# Patient Record
Sex: Female | Born: 1945 | Race: White | Hispanic: No
Health system: Southern US, Community
[De-identification: ages and names within clinical notes are randomized; demographics above are authoritative.]

## PROBLEM LIST (undated history)

## (undated) DIAGNOSIS — I10 Essential (primary) hypertension: Secondary | ICD-10-CM

---

## 2017-10-19 ENCOUNTER — Emergency Department (HOSPITAL_COMMUNITY): Payer: Medicare Other

## 2017-10-19 ENCOUNTER — Encounter (HOSPITAL_COMMUNITY): Payer: Self-pay | Admitting: Emergency Medicine

## 2017-10-19 ENCOUNTER — Emergency Department (HOSPITAL_COMMUNITY)
Admission: EM | Admit: 2017-10-19 | Discharge: 2017-10-19 | Disposition: A | Discharge status: Home / Self Care | Payer: Medicare Other | Attending: Emergency Medicine | Admitting: Emergency Medicine

## 2017-10-19 DIAGNOSIS — S4981XA Other specified injuries of right shoulder and upper arm, initial encounter: Secondary | ICD-10-CM | POA: Diagnosis present

## 2017-10-19 DIAGNOSIS — X500XXA Overexertion from strenuous movement or load, initial encounter: Secondary | ICD-10-CM | POA: Diagnosis not present

## 2017-10-19 DIAGNOSIS — Y9384 Activity, sleeping: Secondary | ICD-10-CM | POA: Diagnosis not present

## 2017-10-19 DIAGNOSIS — Y999 Unspecified external cause status: Secondary | ICD-10-CM | POA: Insufficient documentation

## 2017-10-19 DIAGNOSIS — Y929 Unspecified place or not applicable: Secondary | ICD-10-CM | POA: Diagnosis not present

## 2017-10-19 DIAGNOSIS — Z79899 Other long term (current) drug therapy: Secondary | ICD-10-CM | POA: Diagnosis not present

## 2017-10-19 DIAGNOSIS — I10 Essential (primary) hypertension: Secondary | ICD-10-CM | POA: Diagnosis not present

## 2017-10-19 DIAGNOSIS — S43004A Unspecified dislocation of right shoulder joint, initial encounter: Secondary | ICD-10-CM

## 2017-10-19 HISTORY — DX: Essential (primary) hypertension: I10

## 2017-10-19 MED ORDER — MORPHINE SULFATE (PF) 4 MG/ML IV SOLN
4.0000 mg | Freq: Once | INTRAVENOUS | Status: AC
  Administered 2017-10-19: 4 mg via INTRAVENOUS
  Filled 2017-10-19: qty 1

## 2017-10-19 MED ORDER — PROPOFOL 10 MG/ML IV BOLUS
0.5000 mg/kg | Freq: Once | INTRAVENOUS | Status: DC
  Filled 2017-10-19: qty 20

## 2017-10-19 MED ORDER — SODIUM CHLORIDE 0.9 % IV BOLUS
1000.0000 mL | Freq: Once | INTRAVENOUS | Status: AC
  Administered 2017-10-19: 1000 mL via INTRAVENOUS

## 2017-10-19 MED ORDER — ONDANSETRON HCL 4 MG/2ML IJ SOLN
4.0000 mg | Freq: Once | INTRAMUSCULAR | Status: AC
  Administered 2017-10-19: 4 mg via INTRAVENOUS
  Filled 2017-10-19: qty 2

## 2017-10-19 MED ORDER — HYDROMORPHONE HCL 1 MG/ML IJ SOLN
0.5000 mg | Freq: Once | INTRAMUSCULAR | Status: AC
  Administered 2017-10-19: 0.5 mg via INTRAVENOUS
  Filled 2017-10-19: qty 1

## 2017-10-19 NOTE — ED Provider Notes (Signed)
[facility]-EMERGENCY DEPT Provider Note   CSN: [id number] Arrival date & time: 10/19/17  1204     History   Chief Complaint Chief Complaint  Patient presents with  . Dislocated shoulder    HPI Alicia Roman is a 72 y.o. female with history of hypertension presents for evaluation of acute onset, constant right shoulder pain since early this morning.  On 6/5 she sustained a fall on her outst[place] airport.  She denies head injury or loss of consciousness.  Her right shoulder was dislocated and a reduction was performed at an outside hospital in [place].  She was placed in a sling and states that her recovery has been uneventful until this morning when she rolled over in bed half asleep and raised her right upper extremity above her head  At around 9 AM.  She notes immediate onset of throbbing pain to the right shoulder as well as numbness and tingling of her bilateral hands.  Pain worsens with any movement or attempts at palpation.  She has not been able to move the extremity since this occurred.  She thinks it is dislocated again.  Denies neck pain, chest pain, or shortness of breath.  She went to see Dr. Aundria Rudogers at [facility] earlier today and a reduction was attempted in the office but was unsuccessful.  He recommended presentation to the ED for second attempt.  Of note, the patient did not take her blood pressure medicine today.  The history is provided by the patient.    Past Medical History:  Diagnosis Date  . Hypertension     There are no active problems to display for this patient.  OB History   None      Home Medications    Prior to Admission medications   Medication Sig Start Date End Date Taking? Authorizing Provider  CALCIUM PO Take 1 tablet by mouth daily.    Yes [provider]  cholecalciferol (VITAMIN D) 1000 units tablet Take 1,000 Units by mouth daily.   Yes [provider]  hydrochlorothiazide  (HYDRODIURIL) 25 MG tablet Take 25 mg by mouth daily.   Yes [provider]  lisinopril (PRINIVIL,ZESTRIL) 40 MG tablet Take 40 mg by mouth daily.   Yes [provider]  Multiple Vitamins-Minerals (MULTIVITAMIN ADULT PO) Take 1 tablet by mouth daily.    Yes [provider]  Omega-3 Fatty Acids (FISH OIL) 1000 MG CAPS Take 1 capsule by mouth daily.    Yes [provider]    Family History No family history on file.  Social History Social History   Tobacco Use  . Smoking status: Not on file  Substance Use Topics  . Alcohol use: Not on file  . Drug use: Not on file     Allergies   Patient has no known allergies.   Review of Systems Review of Systems  Constitutional: Negative for chills and fever.  Musculoskeletal: Positive for arthralgias. Negative for neck pain.  Neurological: Positive for numbness. Negative for syncope and headaches.  All other systems reviewed and are negative.    Physical Exam Updated Vital Signs BP 124/62   Pulse 67   Resp 14   Wt 79.4 kg (175 lb)   SpO2 94%   Physical Exam  Constitutional: She appears well-developed and well-nourished. No distress.  HENT:  Head: Normocephalic and atraumatic.  Eyes: Conjunctivae are normal. Right eye exhibits no discharge. Left eye exhibits no discharge.  Neck: Normal range of  motion. Neck supple. No JVD present. No tracheal deviation present.  No midline spine TTP, no paraspinal muscle tenderness, no deformity, crepitus, or step-off noted   Cardiovascular: Normal rate, regular rhythm and intact distal pulses.  2+ radial pulses bilaterally  Pulmonary/Chest: Effort normal and breath sounds normal.  Abdominal: Soft. Bowel sounds are normal. She exhibits no distension. There is no tenderness.  Musculoskeletal: She exhibits tenderness. She exhibits no edema.  Tenderness to palpation of the right shoulder anteriorly.  No active range of motion of the right shoulder, unable to  tolerate passive range of motion secondary to pain.  Good grip strength bilaterally  Neurological: She is alert.  Fluent speech with no evidence of dysarthria or aphasia, no facial droop.  Altered sensation to soft touch of the bilateral hands.  Skin: Skin is warm and dry. No erythema.  Psychiatric: She has a normal mood and affect. Her behavior is normal.  Nursing note and vitals reviewed.    ED Treatments / Results  Labs (all labs ordered are listed, but only abnormal results are displayed) Labs Reviewed - No data to display  EKG None  Radiology Dg Shoulder Right  Result Date: 10/19/2017 CLINICAL DATA:  Status post reduction of right shoulder dislocation. EXAM: RIGHT SHOULDER - 2+ VIEW COMPARISON:  10/19/2017 at 1444 hours FINDINGS: The previously seen dislocation has been reduced, and the humeral head is now seated in the glenoid fossa. No acute fracture is identified. The soft tissues are unremarkable. IMPRESSION: Interval reduction of right shoulder dislocation. Electronically Signed   By: Sebastian Ache M.D.   On: 10/19/2017 16:54   Dg Shoulder Right  Result Date: 10/19/2017 CLINICAL DATA:  Postreduction. EXAM: RIGHT SHOULDER - 2+ VIEW COMPARISON:  Right shoulder x-rays from same day. FINDINGS: Persistent anterior dislocation of the humeral head with respect to the glenoid. No acute fracture. IMPRESSION: Persistent anterior shoulder dislocation. Electronically Signed   By: Obie Dredge M.D.   On: 10/19/2017 15:02   Dg Shoulder Right  Result Date: 10/19/2017 CLINICAL DATA:  Shoulder dislocation. EXAM: RIGHT SHOULDER - 2+ VIEW COMPARISON:  None. FINDINGS: Three view study shows anterior subcoracoid dislocation of the right humeral head. No associated fracture fragment evident. Coracoclavicular and acromioclavicular distances are preserved. IMPRESSION: Anterior right humeral head dislocation. Electronically Signed   By: Kennith Center M.D.   On: 10/19/2017 13:57     Procedures Reduction of dislocation Date/Time: 10/19/2017 6:23 PM Performed by: Cathren Laine, MD Authorized by: Cathren Laine, MD  Consent: Verbal consent obtained. Written consent obtained. Risks and benefits: risks, benefits and alternatives were discussed Consent given by: patient Patient understanding: patient states understanding of the procedure being performed Patient consent: the patient's understanding of the procedure matches consent given Procedure consent: procedure consent matches procedure scheduled Relevant documents: relevant documents present and verified Test results: test results available and properly labeled Imaging studies: imaging studies available Required items: required blood products, implants, devices, and special equipment available Patient identity confirmed: verbally with patient and arm band Local anesthesia used: no  Anesthesia: Local anesthesia used: no  Sedation: Patient sedated: no  Patient tolerance: Patient tolerated the procedure well with no immediate complications    (including critical care time)  Medications Ordered in ED Medications  propofol (DIPRIVAN) 10 mg/mL bolus/IV push 39.7 mg (39.7 mg Intravenous Not Given 10/19/17 1743)  ondansetron (ZOFRAN) injection 4 mg (4 mg Intravenous Given 10/19/17 1308)  morphine 4 MG/ML injection 4 mg (4 mg Intravenous Given 10/19/17 1308)  sodium chloride 0.9 %  bolus 1,000 mL (0 mLs Intravenous Stopped 10/19/17 1415)  HYDROmorphone (DILAUDID) injection 0.5 mg (0.5 mg Intravenous Given 10/19/17 1354)  ondansetron (ZOFRAN) injection 4 mg (4 mg Intravenous Given 10/19/17 1355)     Initial Impression / Assessment and Plan / ED Course  I have reviewed the triage vital signs and the nursing notes.  Pertinent labs & imaging results that were available during my care of the patient were reviewed by me and considered in my medical decision making (see chart for details).     Patient presents with  right anterior humeral head dislocation.  She dislocated her shoulder 8 days ago and this was reduced at an outside hospital.  She is afebrile, initially quite hypertensive but had not been taking her blood pressure medicines and was in a considerable amount of pain on initial evaluation.  Nontoxic in appearance.  She had numbness to soft touch of the bilateral upper extremities.  Radiographs show anterior right humeral head dislocation.  Dr. Denton Lank attempted reduction without conscious sedation, repeat radiographs show persistent shoulder dislocation.  Second attempt was successful.  On reevaluation she is neurovascularly intact.  She was placed in a shoulder immobilizer.  She will follow-up with Dr. Aundria Rud at [facility] for reevaluation or with her orthopedist in [place] when she returns home.  She has had significant improvement in her pain. Compartments are soft. Discussed strict ED return precautions.  Patient and patient's daughter verbalized understanding of and agreement with plan and patient is stable for discharge home at this time.  Patient was seen and evaluated by Dr. Denton Lank who agrees with assessment and plan at this time.  Final Clinical Impressions(s) / ED Diagnoses   Final diagnoses:  Dislocation of right shoulder joint, initial encounter    ED Discharge Orders    None       Bennye Alm 10/19/17 2034    Cathren Laine, MD 10/20/17 [phone]

## 2017-10-19 NOTE — Discharge Instructions (Signed)
You may alternate 600 mg of ibuprofen and [phone] mg of Tylenol every 3-4  hours as needed for pain. Do not exceed 4000 mg of Tylenol daily.  Take ibuprofen with food to avoid upset stomach issues.  You may apply ice or heat to the shoulder 20 minutes on 20 minutes off as needed for comfort.  Wear the shoulder sling at all times except when showering, make sure you do not move your shoulder much when you are not wearing the immobilizer.  Call the orthopedist to schedule a follow-up appointment in the next 1 to 2 weeks.  Do not take off the shoulder immobilizer until they tell you to.  Return to the emergency department if any concerning signs or symptoms develop such as fevers, redness of the joint, weakness, loss of pulses, or significant swelling.

## 2017-10-19 NOTE — ED Triage Notes (Signed)
Per patient/daughter-states she [place] the airport [place] and dislocated right shoulder-had it put back in place-states came back form vacation yesterday and while she was sleeping she rolled over and dislocated it again-states she went to Ortho and they were unable to get it back into place-sent her her for further eval-before and after films were done at office

## 2018-11-20 ENCOUNTER — Other Ambulatory Visit: Payer: Medicare Other

## 2019-08-24 IMAGING — DX DG SHOULDER 2+V*R*
2 series · 2 of 2 positions shown · non-contrast
Comparison: 10/19/2017 at 3888 hours

CLINICAL DATA: Status post reduction of right shoulder dislocation.

EXAM:
RIGHT SHOULDER - 2+ VIEW

[shoulder axial]
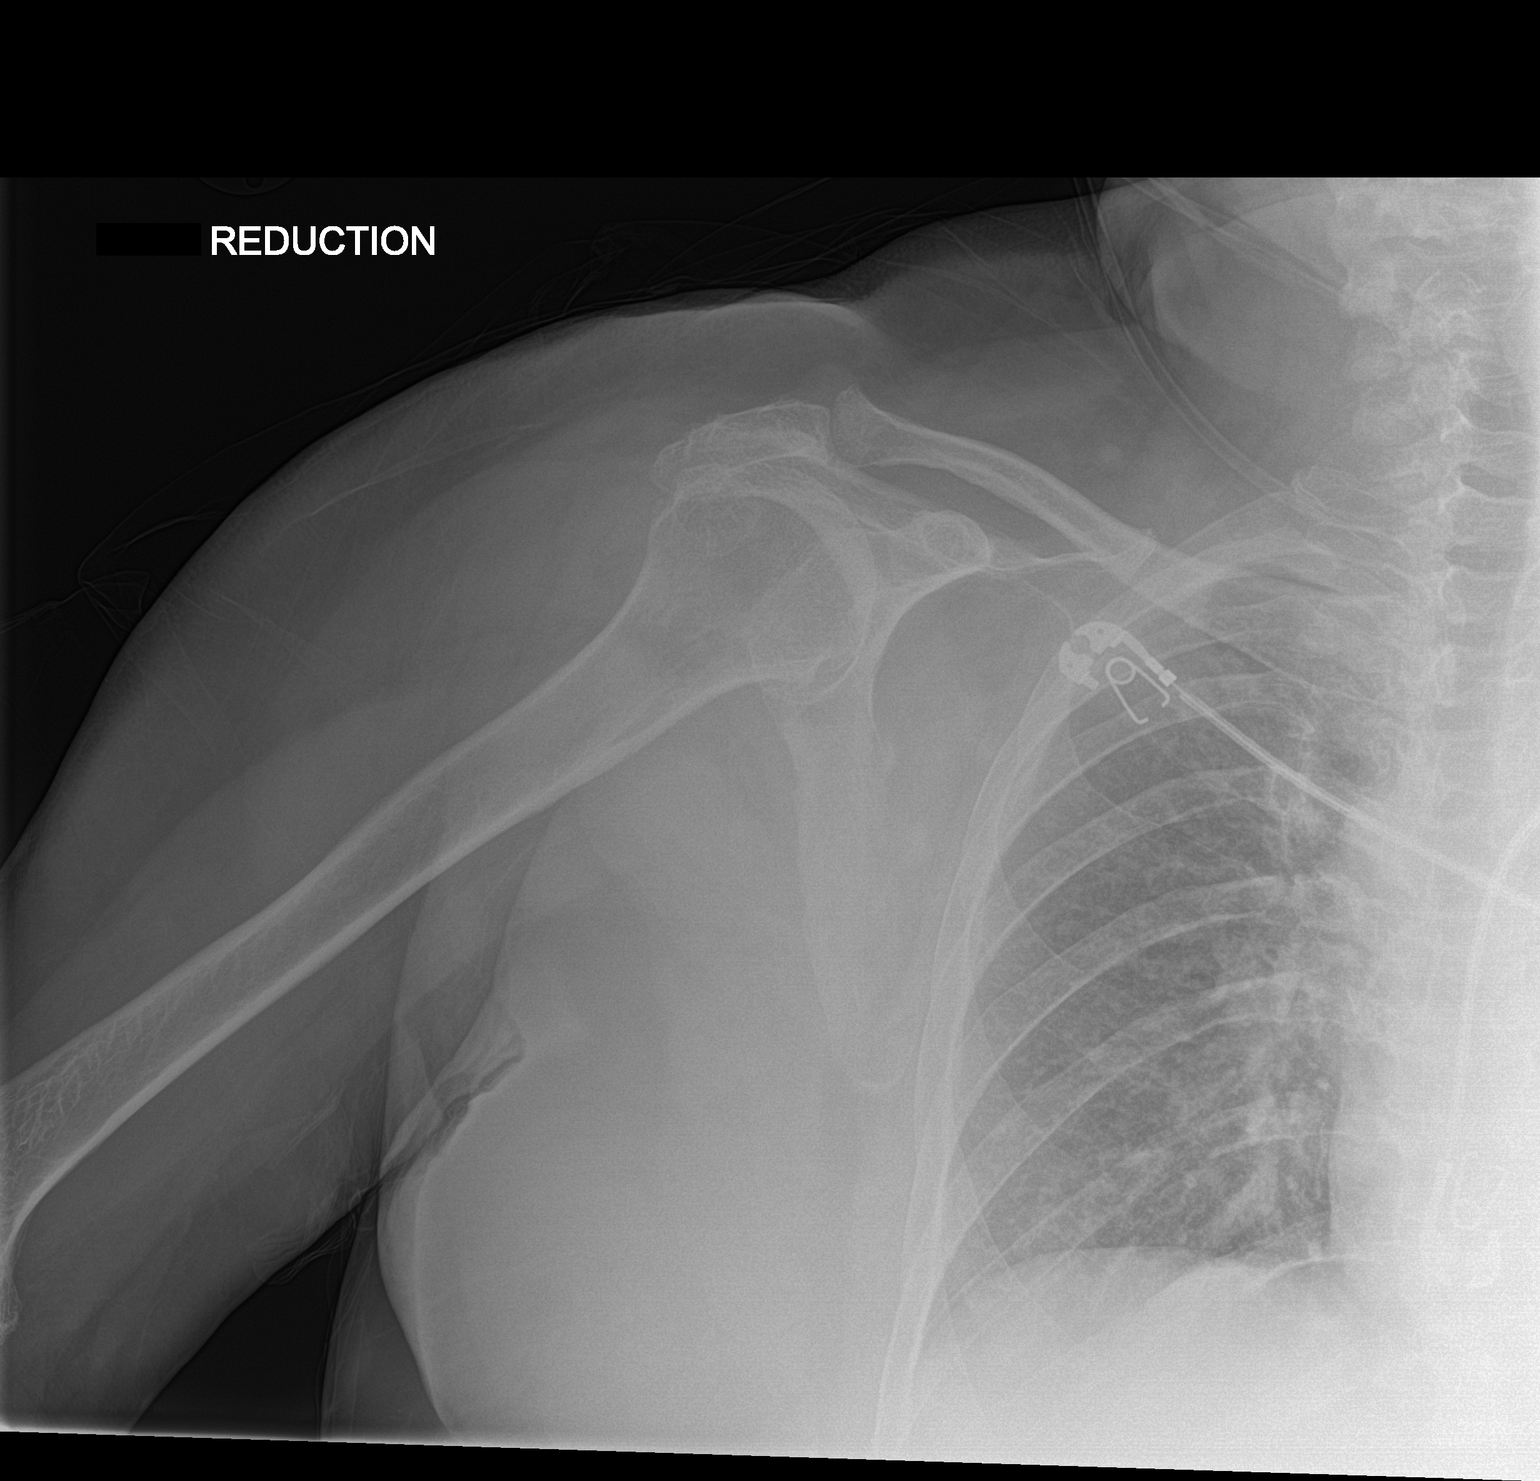

[shoulder ap]
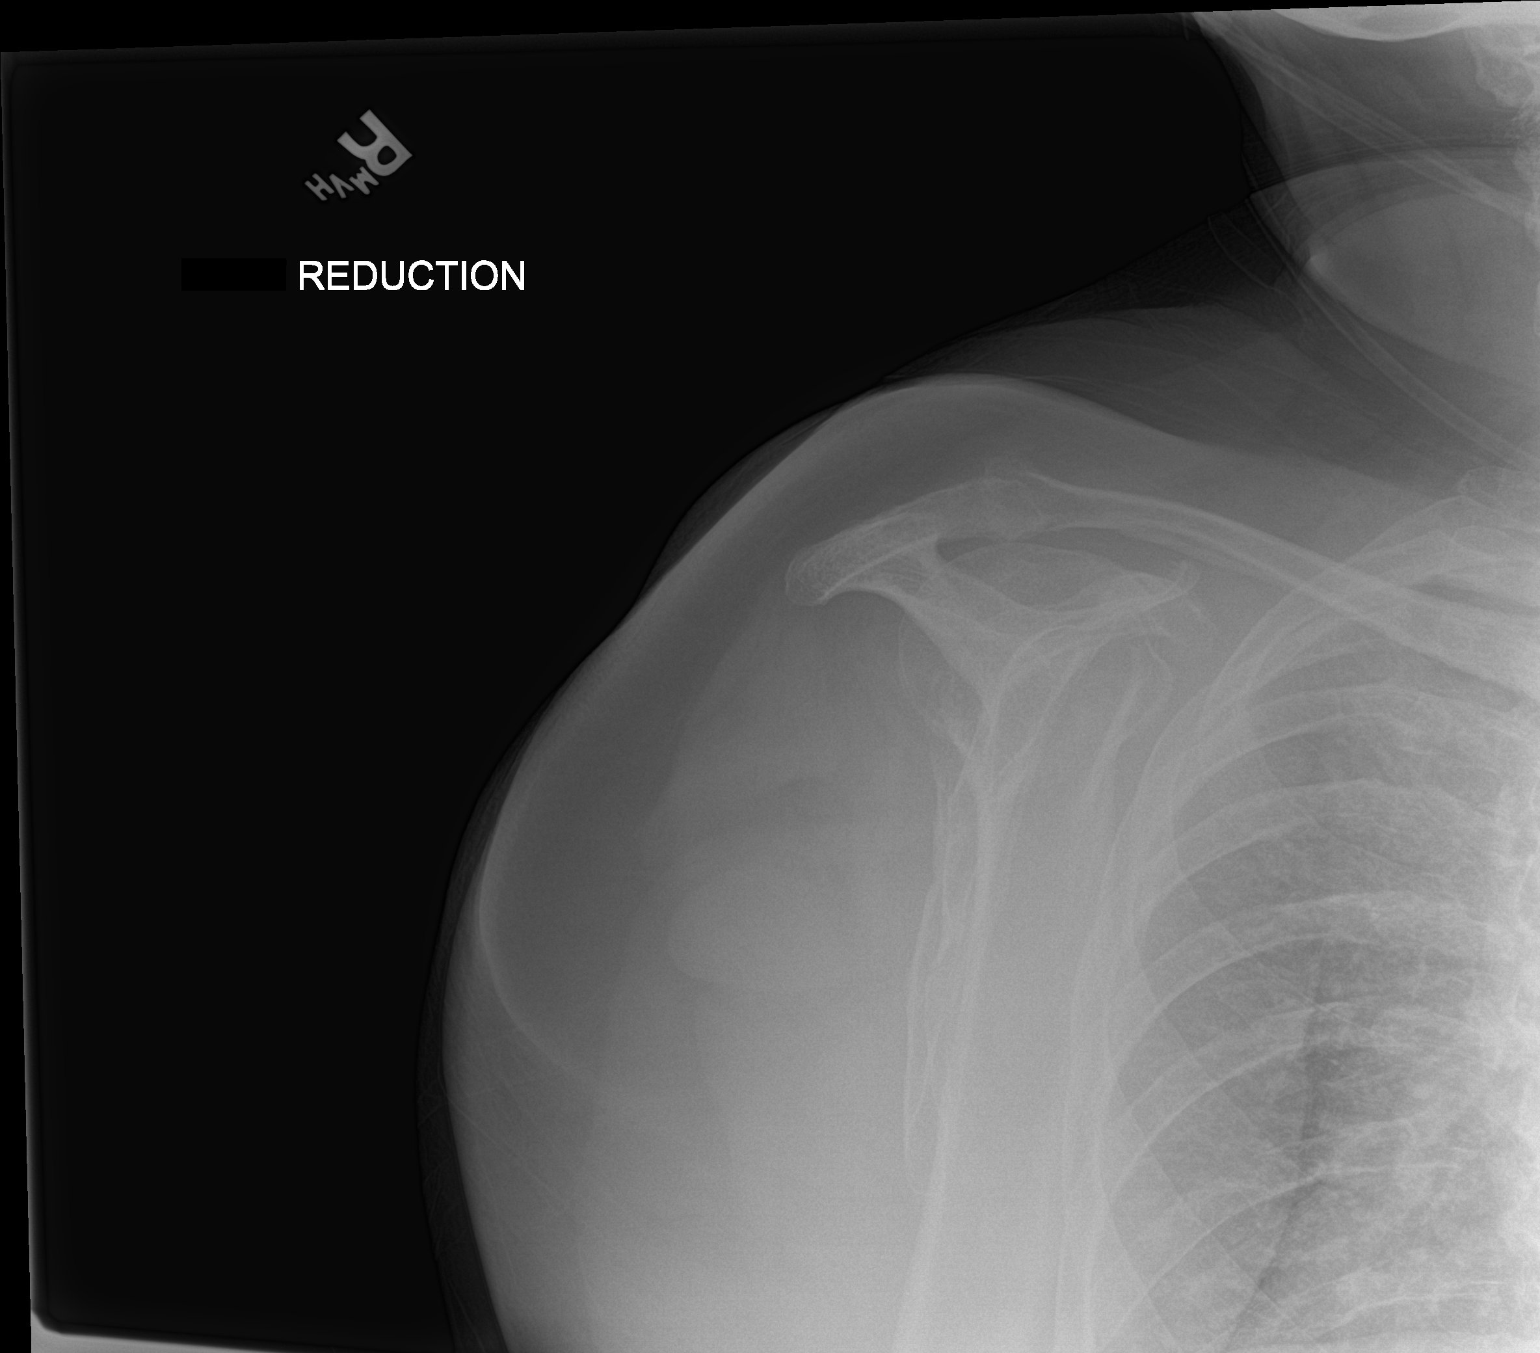

[2 of 2 positions shown; findings below may reference images not displayed]

FINDINGS: The previously seen dislocation has been reduced, and the humeral
head is now seated in the glenoid fossa. No acute fracture is
identified. The soft tissues are unremarkable.
IMPRESSION: Interval reduction of right shoulder dislocation.

## 2019-08-24 IMAGING — CR DG SHOULDER 2+V*R*
3 series · 3 of 3 positions shown · non-contrast
Comparison: None.

CLINICAL DATA: Shoulder dislocation.

EXAM:
RIGHT SHOULDER - 2+ VIEW

[x shoulder ap right (1 of 3)]
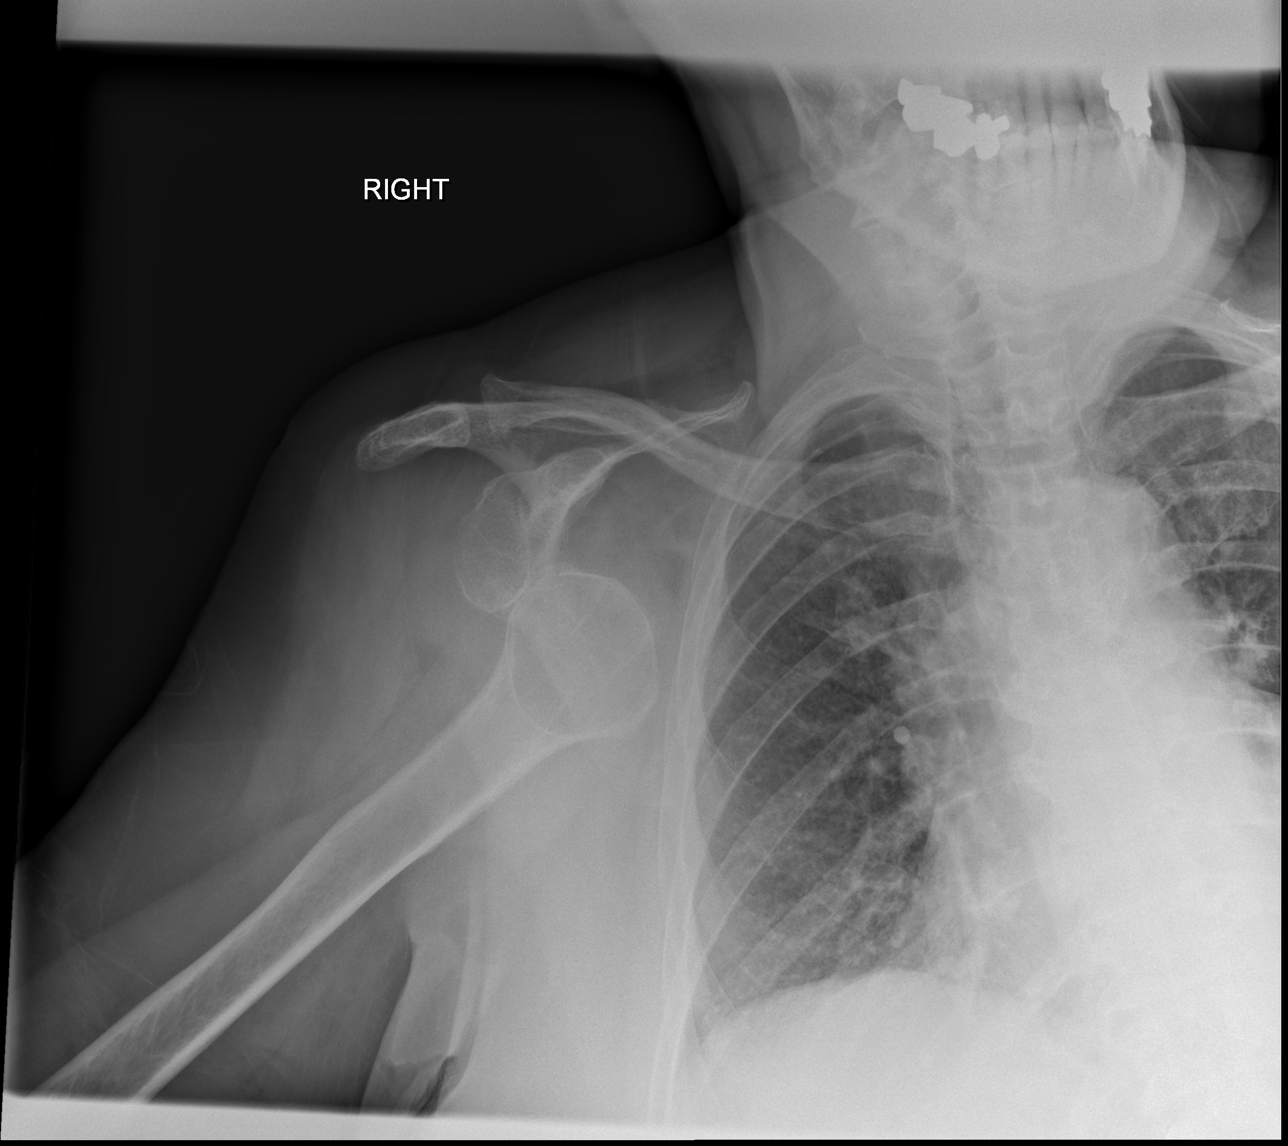

[x shoulder ap right (2 of 3)]
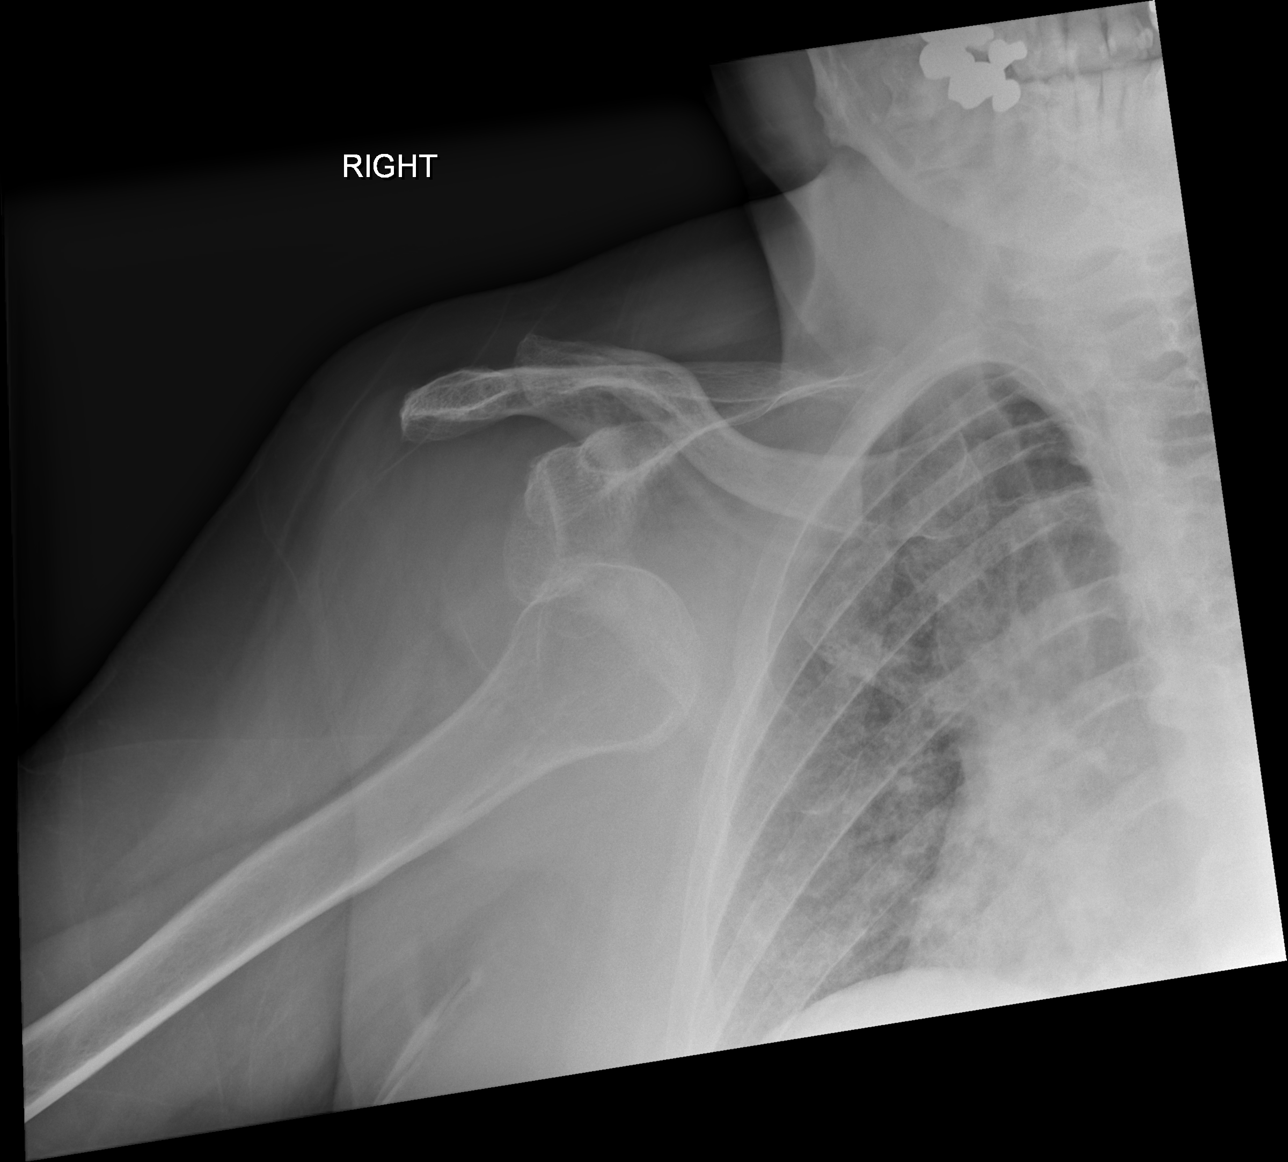

[x shoulder ap right (3 of 3)]
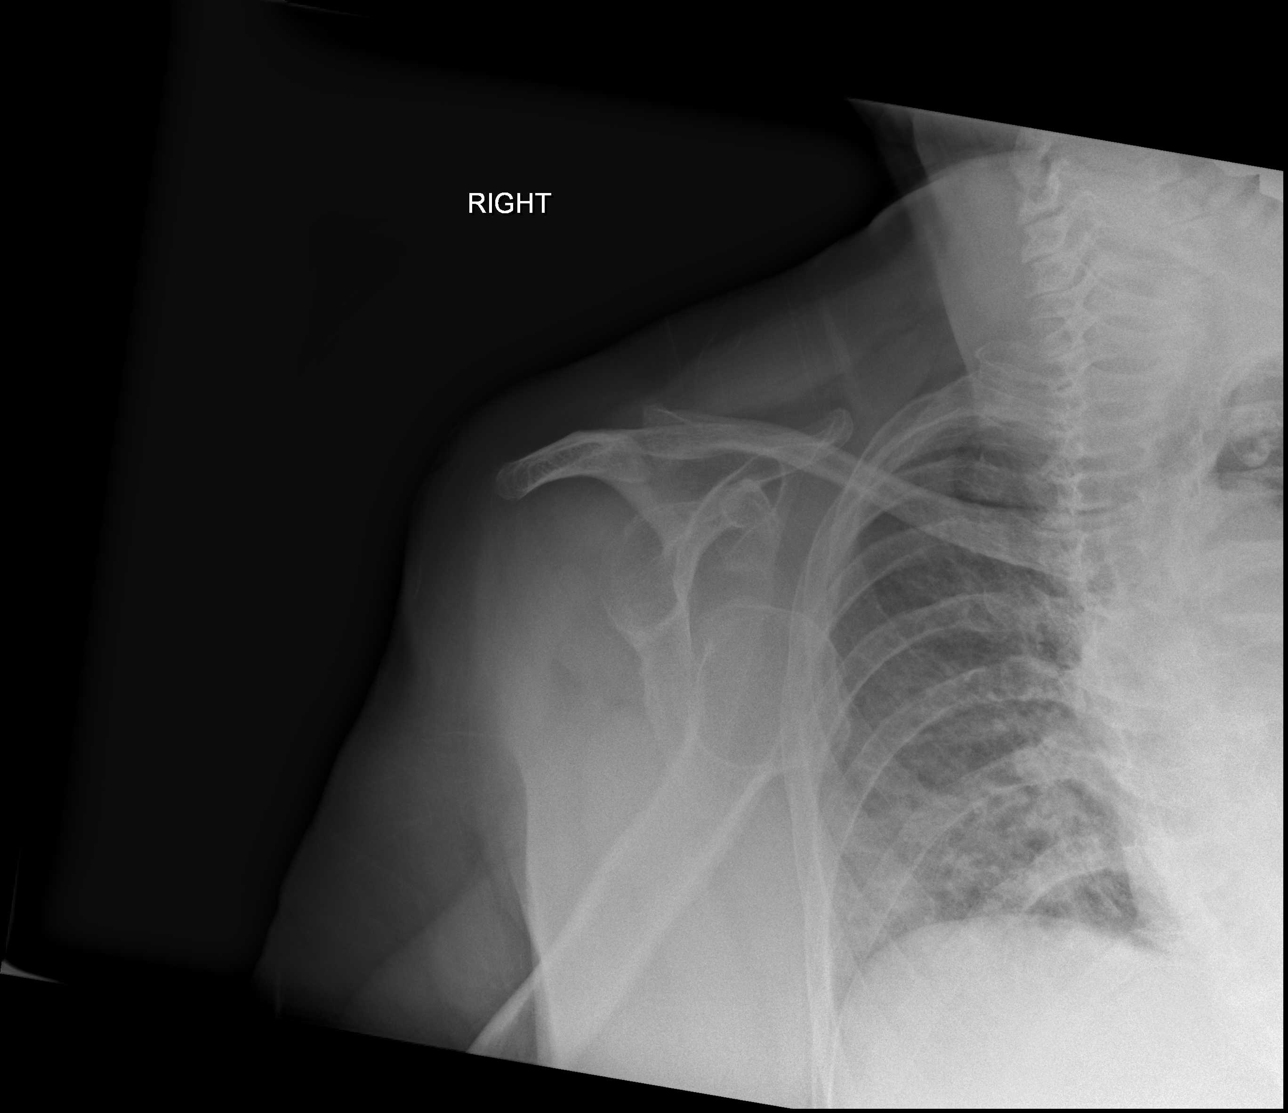

[3 of 3 positions shown; findings below may reference images not displayed]

FINDINGS: Three view study shows anterior subcoracoid dislocation of the right
humeral head. No associated fracture fragment evident.
Coracoclavicular and acromioclavicular distances are preserved.
IMPRESSION: Anterior right humeral head dislocation.
